# Patient Record
Sex: Male | Born: 1990 | Race: Black or African American | Hispanic: No | Marital: Single | Smoking: Current every day smoker
Health system: Southern US, Community
[De-identification: ages and names within clinical notes are randomized; demographics above are authoritative.]

---
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
## 2013-08-22 ENCOUNTER — Emergency Department: Payer: Self-pay | Admitting: Emergency Medicine

## 2016-11-10 ENCOUNTER — Emergency Department
Admission: EM | Admit: 2016-11-10 | Discharge: 2016-11-10 | Disposition: A | Discharge status: Home / Self Care | Payer: Managed Care, Other (non HMO) | Attending: Emergency Medicine | Admitting: Emergency Medicine

## 2016-11-10 ENCOUNTER — Encounter: Payer: Self-pay | Admitting: Emergency Medicine

## 2016-11-10 DIAGNOSIS — Y990 Civilian activity done for income or pay: Secondary | ICD-10-CM | POA: Diagnosis not present

## 2016-11-10 DIAGNOSIS — X500XXA Overexertion from strenuous movement or load, initial encounter: Secondary | ICD-10-CM | POA: Insufficient documentation

## 2016-11-10 DIAGNOSIS — Y929 Unspecified place or not applicable: Secondary | ICD-10-CM | POA: Insufficient documentation

## 2016-11-10 DIAGNOSIS — F17200 Nicotine dependence, unspecified, uncomplicated: Secondary | ICD-10-CM | POA: Insufficient documentation

## 2016-11-10 DIAGNOSIS — S39012A Strain of muscle, fascia and tendon of lower back, initial encounter: Secondary | ICD-10-CM | POA: Diagnosis not present

## 2016-11-10 DIAGNOSIS — S3992XA Unspecified injury of lower back, initial encounter: Secondary | ICD-10-CM | POA: Diagnosis present

## 2016-11-10 DIAGNOSIS — Y93F2 Activity, caregiving, lifting: Secondary | ICD-10-CM | POA: Insufficient documentation

## 2016-11-10 MED ORDER — METHOCARBAMOL 500 MG PO TABS: 500.0000 mg | ORAL_TABLET | Freq: Four times a day (QID) | ORAL | 0 refills | Status: DC

## 2016-11-10 MED ORDER — MELOXICAM 15 MG PO TABS: 15.0000 mg | ORAL_TABLET | Freq: Every day | ORAL | 0 refills | Status: DC

## 2016-11-10 NOTE — ED Triage Notes (Signed)
Low back pain after lifting boxes 3 days ago

## 2016-11-10 NOTE — ED Provider Notes (Signed)
[facility] Emergency Department Provider Note  ____________________________________________  Time seen: Approximately 6:29 PM  I have reviewed the triage vital signs and the nursing notes.   HISTORY  Chief Complaint Back Pain    HPI Glen Allen is a 26 y.o. male who presents to emergency room complaining of lower back pain. Patient states that he is at work lifting heavy boxes when he felt a pulling sensation in his lower back. Patient states that the pain has been improving since time of injury but continues to bother him. He denies any radicular symptoms. No bowel or bladder function, saddle anesthesia, para seizures. No direct trauma to the back. The patient is not tried any medications for this complaint prior to arrival.   History reviewed. No pertinent past medical history.  There are no active problems to display for this patient.   History reviewed. No pertinent surgical history.  Prior to Admission medications   Medication Sig Start Date End Date Taking? Authorizing Provider  meloxicam (MOBIC) 15 MG tablet Take 1 tablet (15 mg total) by mouth daily. 11/10/16   Delorise RoyalsJonathan D Cuthriell, PA-C  methocarbamol (ROBAXIN) 500 MG tablet Take 1 tablet (500 mg total) by mouth 4 (four) times daily. 11/10/16   Delorise RoyalsJonathan D Cuthriell, PA-C    Allergies Patient has no known allergies.  No family history on file.  Social History Social History  Substance Use Topics  . Smoking status: Not on file  . Smokeless tobacco: Current User  . Alcohol use Not on file     Review of Systems  Constitutional: No fever/chills Eyes: No visual changes. No discharge ENT: No upper respiratory complaints. Cardiovascular: no chest pain. Respiratory: no cough. No SOB. Gastrointestinal: No abdominal pain.  No nausea, no vomiting.  No diarrhea.  No constipation. Genitourinary: Negative for dysuria. No hematuri Musculoskeletal: Positive for lower back pain Skin: Negative  for rash, abrasions, lacerations, ecchymosis. Neurological: Negative for headaches, focal weakness or numbness. 10-point ROS otherwise negative.  ____________________________________________   PHYSICAL EXAM:  VITAL SIGNS: ED Triage Vitals  Enc Vitals Group     BP 11/10/16 1808 138/81     Pulse Rate 11/10/16 1808 82     Resp 11/10/16 1808 18     Temp 11/10/16 1808 98.1 F (36.7 C)     Temp Source 11/10/16 1808 Oral     SpO2 11/10/16 1808 95 %     Weight 11/10/16 1810 235 lb (106.6 kg)     Height 11/10/16 1810 6\' 1"  (1.854 m)     Head Circumference --      Peak Flow --      Pain Score 11/10/16 1810 6     Pain Loc --      Pain Edu? --      Excl. in GC? --      Constitutional: Alert and oriented. Well appearing and in no acute distress. Eyes: Conjunctivae are normal. PERRL. EOMI. Head: Atraumatic. Neck: No stridor.    Cardiovascular: Normal rate, regular rhythm. Normal S1 and S2.  Good peripheral circulation. Respiratory: Normal respiratory effort without tachypnea or retractions. Lungs CTAB. Good air entry to the bases with no decreased or absent breath sounds. Musculoskeletal: Full range of motion to all extremities. No gross deformities appreciated. No deformities noted a spot upon inspection. Full range of motion to the lumbar spine. Patient is nontender to palpation over the osseous structures of the spine. No paraspinal muscle tenderness. No tenderness to palpation over bilateral sciatic notches. Negative  straight leg raise bilaterally. Dorsalis pedis pulse intact bilateral lower extremities. Sensation intact and equal bilateral lower extremities. Neurologic:  Normal speech and language. No gross focal neurologic deficits are appreciated.  Skin:  Skin is warm, dry and intact. No rash noted. Psychiatric: Mood and affect are normal. Speech and behavior are normal. Patient exhibits appropriate insight and judgement.   ____________________________________________    LABS (all labs ordered are listed, but only abnormal results are displayed)  Labs Reviewed - No data to display ____________________________________________  EKG   ____________________________________________  RADIOLOGY   No results found.  ____________________________________________    PROCEDURES  Procedure(s) performed:    Procedures    Medications - No data to display   ____________________________________________   INITIAL IMPRESSION / ASSESSMENT AND PLAN / ED COURSE  Pertinent labs & imaging results that were available during my care of the patient were reviewed by me and considered in my medical decision making (see chart for details).  Review of the [place] CSRS was performed in accordance of the [facility] prior to dispensing any controlled drugs.     Patient's diagnosis is consistent with lumbar strain. Patient's symptoms are consistent with strain. They are improving at this time. No indication for labs or imaging.. Patient will be discharged home with prescriptions for anti-inflammatories and muscle relaxers to treat symptoms. Patient is to follow up with primary care as needed or otherwise directed. Patient is given ED precautions to return to the ED for any worsening or new symptoms.     ____________________________________________  FINAL CLINICAL IMPRESSION(S) / ED DIAGNOSES  Final diagnoses:  Strain of lumbar region, initial encounter      NEW MEDICATIONS STARTED DURING THIS VISIT:  New Prescriptions   MELOXICAM (MOBIC) 15 MG TABLET    Take 1 tablet (15 mg total) by mouth daily.   METHOCARBAMOL (ROBAXIN) 500 MG TABLET    Take 1 tablet (500 mg total) by mouth 4 (four) times daily.        This chart was dictated using voice recognition software/Dragon. Despite best efforts to proofread, errors can occur which can change the meaning. Any change was purely unintentional.    Racheal Patches, PA-C 11/10/16 Shona Simpson,  MD 11/10/16 2042

## 2016-11-24 ENCOUNTER — Emergency Department
Admission: EM | Admit: 2016-11-24 | Discharge: 2016-11-24 | Disposition: A | Discharge status: Home / Self Care | Payer: Managed Care, Other (non HMO) | Attending: Emergency Medicine | Admitting: Emergency Medicine

## 2016-11-24 DIAGNOSIS — F17210 Nicotine dependence, cigarettes, uncomplicated: Secondary | ICD-10-CM | POA: Insufficient documentation

## 2016-11-24 DIAGNOSIS — H10022 Other mucopurulent conjunctivitis, left eye: Secondary | ICD-10-CM | POA: Insufficient documentation

## 2016-11-24 DIAGNOSIS — H578 Other specified disorders of eye and adnexa: Secondary | ICD-10-CM | POA: Diagnosis present

## 2016-11-24 MED ORDER — GENTAMICIN SULFATE 0.3 % OP SOLN: 1.0000 [drp] | OPHTHALMIC | 0 refills | Status: AC

## 2016-11-24 MED ORDER — NAPHAZOLINE HCL 0.1 % OP SOLN: 1.0000 [drp] | Freq: Four times a day (QID) | OPHTHALMIC | 0 refills | Status: AC | PRN

## 2016-11-24 NOTE — ED Triage Notes (Signed)
Pt reports for the last 3 days his left eye has been red and watery - this am his eye was matted shut - denies itching or pain

## 2016-11-24 NOTE — ED Provider Notes (Signed)
[facility] Emergency Department Provider Note   ____________________________________________   First MD Initiated Contact with Patient 11/24/16 1858     (approximate)  I have reviewed the triage vital signs and the nursing notes.   HISTORY  Chief Complaint Conjunctivitis    HPI Glen Allen is a 26 y.o. male patient complain of purulent discharge from the left eye. Patient state onset 3 days ago and got progressively worse. Patient state his continue discharge and matted eyelids. Patient denies any vision disturbance. Patient believe is right has not become and affect. No palliative measures taken for this complaint. Patient denies pain with this complaint. Patient do not wear contact lenses.   History reviewed. No pertinent past medical history.  There are no active problems to display for this patient.   History reviewed. No pertinent surgical history.  Prior to Admission medications   Medication Sig Start Date End Date Taking? Authorizing Provider  gentamicin (GARAMYCIN) 0.3 % ophthalmic solution Place 1 drop into both eyes every 4 (four) hours. 11/24/16   Joni Reining, PA-C  meloxicam (MOBIC) 15 MG tablet Take 1 tablet (15 mg total) by mouth daily. 11/10/16   Delorise Royals Cuthriell, PA-C  methocarbamol (ROBAXIN) 500 MG tablet Take 1 tablet (500 mg total) by mouth 4 (four) times daily. 11/10/16   Delorise Royals Cuthriell, PA-C  naphazoline (NAPHCON) 0.1 % ophthalmic solution Place 1 drop into both eyes 4 (four) times daily as needed for irritation. 11/24/16   Joni Reining, PA-C    Allergies Patient has no known allergies.  No family history on file.  Social History Social History  Substance Use Topics  . Smoking status: Current Every Day Smoker    Packs/day: 0.50    Types: Cigarettes  . Smokeless tobacco: Current User  . Alcohol use Yes     Comment: occ    Review of Systems Constitutional: No fever/chills Eyes: No visual  changes. ENT: No sore throat. Drainage and matted eyelids. Cardiovascular: Denies chest pain. Respiratory: Denies shortness of breath. Gastrointestinal: No abdominal pain.  No nausea, no vomiting.  No diarrhea.  No constipation. Genitourinary: Negative for dysuria. Musculoskeletal: Negative for back pain. Skin: Negative for rash. Neurological: Negative for headaches, focal weakness or numbness.    ____________________________________________   PHYSICAL EXAM:  VITAL SIGNS: ED Triage Vitals [11/24/16 1844]  Enc Vitals Group     BP 121/72     Pulse Rate 79     Resp 16     Temp 97.2 F (36.2 C)     Temp Source Oral     SpO2 99 %     Weight 225 lb (102.1 kg)     Height 6\' 1"  (1.854 m)     Head Circumference      Peak Flow      Pain Score 0     Pain Loc      Pain Edu?      Excl. in GC?     Constitutional: Alert and oriented. Well appearing and in no acute distress. Eyes: Conjunctivae are Erythematous. PERRL. EOMI. dry and greenish yellowish secretions left lateral eye. Head: Atraumatic. Nose: No congestion/rhinnorhea. Mouth/Throat: Mucous membranes are moist.  Oropharynx non-erythematous. Neck: No stridor.  No cervical spine tenderness to palpation. Hematological/Lymphatic/Immunilogical: No cervical lymphadenopathy. Cardiovascular: Normal rate, regular rhythm. Grossly normal heart sounds.  Good peripheral circulation. Respiratory: Normal respiratory effort.  No retractions. Lungs CTAB. Gastrointestinal: Soft and nontender. No distention. No abdominal bruits. No CVA tenderness. Musculoskeletal:  No lower extremity tenderness nor edema.  No joint effusions. Neurologic:  Normal speech and language. No gross focal neurologic deficits are appreciated. No gait instability. Skin:  Skin is warm, dry and intact. No rash noted. Psychiatric: Mood and affect are normal. Speech and behavior are normal.  ____________________________________________   LABS (all labs ordered are  listed, but only abnormal results are displayed)  Labs Reviewed - No data to display ____________________________________________  EKG   ____________________________________________  RADIOLOGY   ____________________________________________   PROCEDURES  Procedure(s) performed: None  Procedures  Critical Care performed: No  ____________________________________________   INITIAL IMPRESSION / ASSESSMENT AND PLAN / ED COURSE  Pertinent labs & imaging results that were available during my care of the patient were reviewed by me and considered in my medical decision making (see chart for details).  Conjunctivitis. Patient given discharge care. Patient given a prescription for gentamicin and Naphcon eyedrops. Patient given a work note. Patient advised follow-up with family clinic if no improvement 3-5 days.      ____________________________________________   FINAL CLINICAL IMPRESSION(S) / ED DIAGNOSES  Final diagnoses:  Other mucopurulent conjunctivitis of left eye      NEW MEDICATIONS STARTED DURING THIS VISIT:  New Prescriptions   GENTAMICIN (GARAMYCIN) 0.3 % OPHTHALMIC SOLUTION    Place 1 drop into both eyes every 4 (four) hours.   NAPHAZOLINE (NAPHCON) 0.1 % OPHTHALMIC SOLUTION    Place 1 drop into both eyes 4 (four) times daily as needed for irritation.     Note:  This document was prepared using Dragon voice recognition software and may include unintentional dictation errors.    Joni Reiningonald K Raquel Sayres, PA-C 11/24/16 1907    Loleta Roseory Forbach, MD 11/24/16 2021

## 2017-01-07 ENCOUNTER — Encounter: Payer: Self-pay | Admitting: Emergency Medicine

## 2017-01-07 ENCOUNTER — Emergency Department
Admission: EM | Admit: 2017-01-07 | Discharge: 2017-01-07 | Disposition: A | Discharge status: Home / Self Care | Payer: Managed Care, Other (non HMO) | Attending: Emergency Medicine | Admitting: Emergency Medicine

## 2017-01-07 DIAGNOSIS — F17210 Nicotine dependence, cigarettes, uncomplicated: Secondary | ICD-10-CM | POA: Diagnosis not present

## 2017-01-07 DIAGNOSIS — Z79899 Other long term (current) drug therapy: Secondary | ICD-10-CM | POA: Diagnosis not present

## 2017-01-07 DIAGNOSIS — R202 Paresthesia of skin: Secondary | ICD-10-CM

## 2017-01-07 DIAGNOSIS — R200 Anesthesia of skin: Secondary | ICD-10-CM | POA: Diagnosis present

## 2017-01-07 MED ORDER — METHYLPREDNISOLONE 4 MG PO TBPK: ORAL_TABLET | ORAL | 0 refills | Status: DC

## 2017-01-07 NOTE — ED Triage Notes (Signed)
Right thumb and index finger with numbness for about a week.

## 2017-01-07 NOTE — ED Provider Notes (Signed)
[facility] Emergency Department Provider Note   ____________________________________________   First MD Initiated Contact with Patient 01/07/17 1035     (approximate)  I have reviewed the triage vital signs and the nursing notes.   HISTORY  Chief Complaint Numbness    HPI Glen Allen is a 26 y.o. male patient complain of numbness involving the right thumb and right index finger for about one week. 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 Patient is left-hand dominant. Except for smoking patient's past medical history is not conducted to complaint.  History reviewed. No pertinent past medical history.  There are no active problems to display for this patient.   History reviewed. No pertinent surgical history.  Prior to Admission medications   Medication Sig Start Date End Date Taking? Authorizing Provider  gentamicin (GARAMYCIN) 0.3 % ophthalmic solution Place 1 drop into both eyes every 4 (four) hours. 11/24/16   Joni Reiningonald K Smith, PA-C  meloxicam (MOBIC) 15 MG tablet Take 1 tablet (15 mg total) by mouth daily. 11/10/16   Delorise RoyalsJonathan D Cuthriell, PA-C  methocarbamol (ROBAXIN) 500 MG tablet Take 1 tablet (500 mg total) by mouth 4 (four) times daily. 11/10/16   Delorise RoyalsJonathan D Cuthriell, PA-C  methylPREDNISolone (MEDROL DOSEPAK) 4 MG TBPK tablet Take Tapered dose as directed 01/07/17   Joni Reiningonald K Smith, PA-C  naphazoline [facility](NAPHCON) 0.1 % ophthalmic solution Place 1 drop into both eyes 4 (four) times daily as needed for irritation. 11/24/16   Joni Reiningonald K Smith, PA-C    Allergies Patient has no known allergies.  No family history on file.  Social History Social History  Substance Use Topics  . Smoking status: Current Every Day Smoker    Packs/day: 0.50    Types: Cigarettes  . Smokeless tobacco: Current User  . Alcohol use Yes     Comment: occ    Review of Systems Constitutional: No fever/chills Eyes: No visual  changes. ENT: No sore throat. Cardiovascular: Denies chest pain. Respiratory: Denies shortness of breath. Gastrointestinal: No abdominal pain.  No nausea, no vomiting.  No diarrhea.  No constipation. Genitourinary: Negative for dysuria. Musculoskeletal: Negative for back pain. Skin: Negative for rash. Neurological: Transient numbness to the first second digit right hand.    ____________________________________________   PHYSICAL EXAM:  VITAL SIGNS: ED Triage Vitals  Enc Vitals Group     BP 01/07/17 1013 102/72     Pulse Rate 01/07/17 1013 (!) 54     Resp 01/07/17 1013 15     Temp 01/07/17 1013 98.2 F (36.8 C)     Temp Source 01/07/17 1013 Oral     SpO2 01/07/17 1013 100 %     Weight 01/07/17 1014 215 lb (97.5 kg)     Height 01/07/17 1014 6\' 1"  (1.854 m)     Head Circumference --      Peak Flow --      Pain Score --      Pain Loc --      Pain Edu? --      Excl. in GC? --     Constitutional: Alert and oriented. Well appearing and in no acute distress. Eyes: Conjunctivae are normal. PERRL. EOMI. Head: Atraumatic. Nose: No congestion/rhinnorhea. Mouth/Throat: Mucous membranes are moist.  Oropharynx non-erythematous. Neck: No stridor.  No cervical spine tenderness to palpation. Hematological/Lymphatic/Immunilogical: No cervical lymphadenopathy. Cardiovascular: Asymptomatic bradycardia.. Grossly normal heart sounds.  Good peripheral circulation. Respiratory: Normal respiratory effort.  No retractions. Lungs CTAB. Gastrointestinal:  Soft and nontender. No distention. No abdominal bruits. No CVA tenderness. Musculoskeletal: No lower extremity tenderness nor edema.  No joint effusions. Neurologic:  Normal speech and language. No gross focal neurologic deficits are appreciated. No gait instability. Skin:  Skin is warm, dry and intact. No rash noted. Psychiatric: Mood and affect are normal. Speech and behavior are normal.  ____________________________________________    LABS (all labs ordered are listed, but only abnormal results are displayed)  Labs Reviewed - No data to display ____________________________________________  EKG   ____________________________________________  RADIOLOGY   ____________________________________________   PROCEDURES  Procedure(s) performed: None  Procedures  Critical Care performed: No  ____________________________________________   INITIAL IMPRESSION / ASSESSMENT AND PLAN / ED COURSE  Pertinent labs & imaging results that were available during my care of the patient were reviewed by me and considered in my medical decision making (see chart for details).  Paresthesia first and second digit right hand. Patient given discharge care instructions. Patient given a prescription for Medrol Dosepak. Patient advised follow-up with neurology if no improvement in one week.      ____________________________________________   FINAL CLINICAL IMPRESSION(S) / ED DIAGNOSES  Final diagnoses:  Paresthesia      NEW MEDICATIONS STARTED DURING THIS VISIT:  New Prescriptions   METHYLPREDNISOLONE (MEDROL DOSEPAK) 4 MG TBPK TABLET    Take Tapered dose as directed     Note:  This document was prepared using Dragon voice recognition software and may include unintentional dictation errors.    Joni Reining, PA-C 01/07/17 1044    Joni Reining, PA-C 01/07/17 1045    Minna Antis, MD 01/07/17 1501

## 2017-01-07 NOTE — ED Notes (Signed)
FIRST NURSE: right hand numbness 1st and 2nd digit x1 week.

## 2017-05-14 ENCOUNTER — Encounter: Payer: Self-pay | Admitting: *Deleted

## 2017-05-14 ENCOUNTER — Emergency Department
Admission: EM | Admit: 2017-05-14 | Discharge: 2017-05-14 | Disposition: A | Discharge status: Home / Self Care | Payer: Managed Care, Other (non HMO) | Attending: Emergency Medicine | Admitting: Emergency Medicine

## 2017-05-14 DIAGNOSIS — Y929 Unspecified place or not applicable: Secondary | ICD-10-CM | POA: Diagnosis not present

## 2017-05-14 DIAGNOSIS — Y999 Unspecified external cause status: Secondary | ICD-10-CM | POA: Insufficient documentation

## 2017-05-14 DIAGNOSIS — Z79899 Other long term (current) drug therapy: Secondary | ICD-10-CM | POA: Insufficient documentation

## 2017-05-14 DIAGNOSIS — S39012A Strain of muscle, fascia and tendon of lower back, initial encounter: Secondary | ICD-10-CM | POA: Diagnosis not present

## 2017-05-14 DIAGNOSIS — F17210 Nicotine dependence, cigarettes, uncomplicated: Secondary | ICD-10-CM | POA: Insufficient documentation

## 2017-05-14 DIAGNOSIS — S3992XA Unspecified injury of lower back, initial encounter: Secondary | ICD-10-CM | POA: Diagnosis present

## 2017-05-14 DIAGNOSIS — Y939 Activity, unspecified: Secondary | ICD-10-CM | POA: Diagnosis not present

## 2017-05-14 DIAGNOSIS — X500XXA Overexertion from strenuous movement or load, initial encounter: Secondary | ICD-10-CM | POA: Insufficient documentation

## 2017-05-14 NOTE — ED Provider Notes (Signed)
[facility] Emergency Department Provider Note   ____________________________________________   I have reviewed the triage vital signs and the nursing notes.   HISTORY  Chief Complaint Back Pain    HPI Glen Allen is a 26 y.o. male presents to the emergency department with mild back pain that developed after lifting an item to approximately mid chest tight several days ago. Patient reports during the left increased muscle tightness and lumbar back pain. Patient reports resting and taking ibuprofen with improvement of symptoms. Patient was unable to go to work due to back pain and needs a work note to return to work. Patient denies any bowel or bladder dysfunction, saddle anesthesia or radiculopathy. Patient denies fever, chills, headache, vision changes, chest pain, chest tightness, shortness of breath, abdominal pain, nausea and vomiting.  No past medical history on file.  There are no active problems to display for this patient.   No past surgical history on file.  Prior to Admission medications   Medication Sig Start Date End Date Taking? Authorizing Provider  gentamicin (GARAMYCIN) 0.3 % ophthalmic solution Place 1 drop into both eyes every 4 (four) hours. 11/24/16   Joni ReiningSmith, Ronald K, PA-C  meloxicam (MOBIC) 15 MG tablet Take 1 tablet (15 mg total) by mouth daily. 11/10/16   Cuthriell, Delorise RoyalsJonathan D, PA-C  methocarbamol (ROBAXIN) 500 MG tablet Take 1 tablet (500 mg total) by mouth 4 (four) times daily. 11/10/16   Cuthriell, Delorise RoyalsJonathan D, PA-C  methylPREDNISolone (MEDROL DOSEPAK) 4 MG TBPK tablet Take Tapered dose as directed 01/07/17   Joni ReiningSmith, Ronald K, PA-C  naphazoline [facility](NAPHCON) 0.1 % ophthalmic solution Place 1 drop into both eyes 4 (four) times daily as needed for irritation. 11/24/16   Joni ReiningSmith, Ronald K, PA-C    Allergies Patient has no known allergies.  No family history on file.  Social History Social History  Substance Use Topics  . Smoking  status: Current Every Day Smoker    Packs/day: 0.50    Types: Cigarettes  . Smokeless tobacco: Current User  . Alcohol use Yes     Comment: occ    Review of Systems Constitutional: Negative for fever/chills Eyes: No visual changes. ENT:  Negative for sore throat and for difficulty swallowing Cardiovascular: Denies chest pain. Respiratory: Denies cough. Denies shortness of breath. Gastrointestinal: No abdominal pain.  No nausea, vomiting, diarrhea. Genitourinary: Negative for dysuria. Musculoskeletal: Positive for mild mid to lumbar back pain. Skin: Negative for rash. Neurological: Negative for headaches.  Negative focal weakness or numbness. Negative for loss of consciousness. Able to ambulate. ____________________________________________   PHYSICAL EXAM:  VITAL SIGNS: ED Triage Vitals  Enc Vitals Group     BP 05/14/17 2147 100/76     Pulse Rate 05/14/17 2147 64     Resp 05/14/17 2147 20     Temp 05/14/17 2147 98.7 F (37.1 C)     Temp Source 05/14/17 2147 Oral     SpO2 05/14/17 2147 98 %     Weight 05/14/17 2145 200 lb (90.7 kg)     Height 05/14/17 2145 6\' 1"  (1.854 m)     Head Circumference --      Peak Flow --      Pain Score 05/14/17 2144 5     Pain Loc --      Pain Edu? --      Excl. in GC? --     Constitutional: Alert and oriented. Well appearing and in no acute distress.  Eyes: Conjunctivae are normal.  PERRL. EOMI  Head: Normocephalic and atraumatic. ENT:      Ears: Canals clear. TMs intact bilaterally.      Nose: No congestion/rhinnorhea.      Mouth/Throat: Mucous membranes are moist. Neck:Supple. No thyromegaly. No stridor.  Cardiovascular: Normal rate, regular rhythm. Normal S1 and S2.  Good peripheral circulation. Respiratory: Normal respiratory effort without tachypnea or retractions. Lungs CTAB. Good air entry to the bases with no decreased or absent breath sounds. Hematological/Lymphatic/Immunological: No cervical lymphadenopathy. Cardiovascular:  Normal rate, regular rhythm. Normal distal pulses. Respiratory: Normal respiratory effort. No wheezes/rales/rhonchi. Lungs CTAB with no W/R/R. Gastrointestinal: Bowel sounds 4 quadrants. Soft and nontender to palpation. No guarding or rigidity. No palpable masses. No distention. No CVA tenderness. Musculoskeletal: Mid to lumbar back pain without radiculopathy. Mild palpable tenderness along lumbar paraspinals. Normal spine range of motion without pain. Negative radiculopathy, or bladder dysfunction or saddle anesthesia. Nontender with normal range of motion in all extremities. Neurologic: Normal speech and language. No gross focal neurologic deficits are appreciated. No gait instability. Cranial nerves: II-X intact. No sensory loss or abnormal reflexes.  Skin:  Skin is warm, dry and intact. No rash noted. Psychiatric: Mood and affect are normal. Speech and behavior are normal. Patient exhibits appropriate insight and judgement.  ____________________________________________   LABS (all labs ordered are listed, but only abnormal results are displayed)  Labs Reviewed - No data to display ____________________________________________  EKG None ____________________________________________  RADIOLOGY None ____________________________________________   PROCEDURES  Procedure(s) performed: No    Critical Care performed: no ____________________________________________   INITIAL IMPRESSION / ASSESSMENT AND PLAN / ED COURSE  Pertinent labs & imaging results that were available during my care of the patient were reviewed by me and considered in my medical decision making (see chart for details).  Patient presents to emergency department with lumbar back pain secondary to lifting injury several days ago. History and physical exam findings are reassuring symptoms are consistent with mild lumbar strain. Patient has been managing symptoms with OTC ibuprofen and rest. Recommended he continue  current regimen along with incorporating improved lifting mechanics. Patient needed a return to work note, that was provided during this visit. Patient advised to follow up with PCP as needed or return to the emergency department if symptoms return or worsen. Patient informed of clinical course, understand medical decision-making process, and agree with plan. ____________________________________________   FINAL CLINICAL IMPRESSION(S) / ED DIAGNOSES  Final diagnoses:  Strain of lumbar region, initial encounter       NEW MEDICATIONS STARTED DURING THIS VISIT:  New Prescriptions   No medications on file     Note:  This document was prepared using Dragon voice recognition software and may include unintentional dictation errors.    Clois ComberLittle, Elijiah Mickley M, PA-C 05/14/17 2317    Jeanmarie PlantMcShane, James A, MD 05/17/17 [phone]

## 2017-05-14 NOTE — ED Notes (Signed)

## 2017-05-14 NOTE — ED Triage Notes (Signed)
Pt has mid back pain.  Pt popped back and picked up heavy items at work 2 days ago.  Pt continues to have back pain.  No urinary sx.  Pt alert.  States no WC.

## 2017-05-14 NOTE — Discharge Instructions (Signed)
If he noted symptoms worsening generalized tenderness to return to the emergency department. He may take over-the-counter ibuprofen instructed on the medication bottle for pain and inflammation.

## 2019-03-24 ENCOUNTER — Other Ambulatory Visit: Payer: Self-pay

## 2019-03-24 ENCOUNTER — Emergency Department
Admission: EM | Admit: 2019-03-24 | Discharge: 2019-03-24 | Disposition: A | Discharge status: Home / Self Care | Payer: Self-pay | Attending: Emergency Medicine | Admitting: Emergency Medicine

## 2019-03-24 ENCOUNTER — Encounter: Payer: Self-pay | Admitting: Emergency Medicine

## 2019-03-24 ENCOUNTER — Emergency Department: Payer: Self-pay

## 2019-03-24 DIAGNOSIS — R05 Cough: Secondary | ICD-10-CM | POA: Insufficient documentation

## 2019-03-24 DIAGNOSIS — R0789 Other chest pain: Secondary | ICD-10-CM | POA: Insufficient documentation

## 2019-03-24 DIAGNOSIS — F17210 Nicotine dependence, cigarettes, uncomplicated: Secondary | ICD-10-CM | POA: Insufficient documentation

## 2019-03-24 DIAGNOSIS — F17228 Nicotine dependence, chewing tobacco, with other nicotine-induced disorders: Secondary | ICD-10-CM | POA: Insufficient documentation

## 2019-03-24 DIAGNOSIS — R0602 Shortness of breath: Secondary | ICD-10-CM | POA: Insufficient documentation

## 2019-03-24 MED ORDER — ALBUTEROL SULFATE HFA 108 (90 BASE) MCG/ACT IN AERS: 2.0000 | INHALATION_SPRAY | Freq: Four times a day (QID) | RESPIRATORY_TRACT | 0 refills | Status: AC | PRN

## 2019-03-24 NOTE — ED Triage Notes (Signed)
Pt denies a cough, dizziness or any other sx. States that it feels like he can't take a deep breath but may be anxiety.
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
## 2019-03-24 NOTE — Discharge Instructions (Addendum)
Please seek medical attention for any high fevers, chest pain, shortness of breath, change in behavior, persistent vomiting, bloody stool or any other new or concerning symptoms.  

## 2019-03-24 NOTE — ED Notes (Signed)
Pt given remote to tv and blanket. Pt denies any needs.
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
## 2020-04-01 ENCOUNTER — Other Ambulatory Visit: Payer: Self-pay

## 2020-04-01 ENCOUNTER — Emergency Department
Admission: EM | Admit: 2020-04-01 | Discharge: 2020-04-01 | Disposition: A | Discharge status: Home / Self Care | Payer: Self-pay | Attending: Emergency Medicine | Admitting: Emergency Medicine

## 2020-04-01 ENCOUNTER — Emergency Department: Payer: Self-pay

## 2020-04-01 DIAGNOSIS — S01112A Laceration without foreign body of left eyelid and periocular area, initial encounter: Secondary | ICD-10-CM | POA: Insufficient documentation

## 2020-04-01 DIAGNOSIS — Y999 Unspecified external cause status: Secondary | ICD-10-CM | POA: Insufficient documentation

## 2020-04-01 DIAGNOSIS — Y939 Activity, unspecified: Secondary | ICD-10-CM | POA: Insufficient documentation

## 2020-04-01 DIAGNOSIS — Y929 Unspecified place or not applicable: Secondary | ICD-10-CM | POA: Insufficient documentation

## 2020-04-01 DIAGNOSIS — Z79899 Other long term (current) drug therapy: Secondary | ICD-10-CM | POA: Insufficient documentation

## 2020-04-01 DIAGNOSIS — F17210 Nicotine dependence, cigarettes, uncomplicated: Secondary | ICD-10-CM | POA: Insufficient documentation

## 2020-04-01 DIAGNOSIS — S0990XA Unspecified injury of head, initial encounter: Secondary | ICD-10-CM | POA: Insufficient documentation

## 2020-04-01 MED ORDER — MELOXICAM 15 MG PO TABS
15.0000 mg | ORAL_TABLET | Freq: Every day | ORAL | 0 refills | Status: DC
Start: 2020-04-01 — End: 2021-01-09
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
## 2020-04-01 NOTE — Discharge Instructions (Signed)
Keep laceration clean and dry for the next 24 hours. You can take meloxicam for pain and inflammation.
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
## 2020-04-26 ENCOUNTER — Other Ambulatory Visit: Payer: Self-pay

## 2020-04-26 ENCOUNTER — Ambulatory Visit: Payer: Self-pay | Admitting: Physician Assistant

## 2020-04-26 DIAGNOSIS — Z113 Encounter for screening for infections with a predominantly sexual mode of transmission: Secondary | ICD-10-CM

## 2020-04-26 DIAGNOSIS — Z202 Contact with and (suspected) exposure to infections with a predominantly sexual mode of transmission: Secondary | ICD-10-CM

## 2020-04-26 LAB — GRAM STAIN

## 2020-04-26 MED ORDER — AZITHROMYCIN 500 MG PO TABS
1000.0000 mg | ORAL_TABLET | Freq: Once | ORAL | Status: AC
  Administered 2020-04-26: 1000 mg via ORAL

## 2020-04-26 NOTE — Progress Notes (Signed)
Gram stain reviewed, no treatment indicated. Patient treated with azithromycin per provider orders.Marland KitchenMarland KitchenBurt Knack, RN

## 2020-04-29 ENCOUNTER — Encounter: Payer: Self-pay | Admitting: Physician Assistant

## 2020-04-29 NOTE — Progress Notes (Signed)
   [facility] Department STI clinic/screening visit  Subjective:  Glen Allen is a 29 y.o. male being seen today for an STI screening visit. The patient reports they do not have symptoms.    Patient has the following medical conditions:  There are no problems to display for this patient.    Chief Complaint  Patient presents with  . SEXUALLY TRANSMITTED DISEASE    screening    HPI  Patient reports that he is not having any symptoms but is a contact to Chlamydia.  Denies chronic conditions, surgeries and regular medicines.  States last HIV testing was over 3 years ago.     See flowsheet for further details and programmatic requirements.    The following portions of the patient's history were reviewed and updated as appropriate: allergies, current medications, past medical history, past social history, past surgical history and problem list.  Objective:  There were no vitals filed for this visit.  Physical Exam Constitutional:      General: He is not in acute distress.    Appearance: Normal appearance.  HENT:     Head: Normocephalic and atraumatic.     Comments: No nits, lice, or hair loss. No cervical, supraclavicular or axillary adenopathy.    Mouth/Throat:     Mouth: Mucous membranes are moist.     Pharynx: Oropharynx is clear. No oropharyngeal exudate or posterior oropharyngeal erythema.  Eyes:     Conjunctiva/sclera: Conjunctivae normal.  Pulmonary:     Effort: Pulmonary effort is normal.  Abdominal:     Palpations: Abdomen is soft. There is no mass.     Tenderness: There is no abdominal tenderness. There is no guarding or rebound.  Genitourinary:    Penis: Normal.      Testes: Normal.     Comments: Pubic area without nits, lice, edema, erythema, lesions and inguinal adenopathy. Penis circumcised without rash, lesions and discharge at meatus. Musculoskeletal:     Cervical back: Neck supple. No tenderness.  Skin:    General: Skin is warm and  dry.     Findings: No bruising, erythema, lesion or rash.  Neurological:     Mental Status: He is alert and oriented to person, place, and time.  Psychiatric:        Mood and Affect: Mood normal.        Behavior: Behavior normal.        Thought Content: Thought content normal.        Judgment: Judgment normal.       Assessment and Plan:  Glen Allen is a 29 y.o. male presenting to the [facility] Department for STI screening  1. Screening for STD (sexually transmitted disease) Patient into clinic without symptoms. Rec condoms with all sex. Await test results.  Counseled that RN will call if needs to RTC for treatment once results are back. - Gram stain - Gonococcus culture - HIV [facility] - Syphilis Serology, [facility] - Gonococcus culture  2. Chlamydia contact Treat as a contact to Chlamydia with Azithromycin 1 g po DOT today. No sex for 7 days and until after partner completes treatment. RTC if vomits < 2 hr after taking medicine for re-treatment. - azithromycin (ZITHROMAX) tablet 1,000 mg     No follow-ups on file.  No future appointments.  Glen Holmes, PA

## 2020-04-30 LAB — GONOCOCCUS CULTURE

## 2021-01-09 ENCOUNTER — Other Ambulatory Visit: Payer: Self-pay

## 2021-01-09 ENCOUNTER — Emergency Department: Payer: No Typology Code available for payment source

## 2021-01-09 ENCOUNTER — Emergency Department
Admission: EM | Admit: 2021-01-09 | Discharge: 2021-01-09 | Disposition: A | Discharge status: Home / Self Care | Payer: No Typology Code available for payment source | Attending: Emergency Medicine | Admitting: Emergency Medicine

## 2021-01-09 DIAGNOSIS — F17290 Nicotine dependence, other tobacco product, uncomplicated: Secondary | ICD-10-CM | POA: Insufficient documentation

## 2021-01-09 DIAGNOSIS — M545 Low back pain, unspecified: Secondary | ICD-10-CM | POA: Diagnosis not present

## 2021-01-09 DIAGNOSIS — Y92410 Unspecified street and highway as the place of occurrence of the external cause: Secondary | ICD-10-CM | POA: Insufficient documentation

## 2021-01-09 DIAGNOSIS — M549 Dorsalgia, unspecified: Secondary | ICD-10-CM | POA: Diagnosis present

## 2021-01-09 MED ORDER — METHOCARBAMOL 500 MG PO TABS: 500.0000 mg | ORAL_TABLET | Freq: Three times a day (TID) | ORAL | 0 refills | Status: AC | PRN

## 2021-01-09 MED ORDER — MELOXICAM 15 MG PO TABS: 15.0000 mg | ORAL_TABLET | Freq: Every day | ORAL | 2 refills | Status: AC
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
## 2022-05-13 ENCOUNTER — Ambulatory Visit: Payer: BC Managed Care – PPO | Admitting: Orthopaedic Surgery

## 2022-05-13 ENCOUNTER — Encounter: Payer: Self-pay | Admitting: Orthopaedic Surgery

## 2022-05-13 ENCOUNTER — Ambulatory Visit: Payer: Self-pay

## 2022-05-13 ENCOUNTER — Ambulatory Visit (INDEPENDENT_AMBULATORY_CARE_PROVIDER_SITE_OTHER): Payer: BC Managed Care – PPO

## 2022-05-13 VITALS — BP 129/82

## 2022-05-13 DIAGNOSIS — M25562 Pain in left knee: Secondary | ICD-10-CM

## 2022-05-13 DIAGNOSIS — M25561 Pain in right knee: Secondary | ICD-10-CM

## 2022-05-13 DIAGNOSIS — G8929 Other chronic pain: Secondary | ICD-10-CM
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
## 2023-10-26 ENCOUNTER — Ambulatory Visit: Payer: Self-pay

## 2023-10-27 ENCOUNTER — Ambulatory Visit: Payer: Self-pay | Admitting: Family Medicine

## 2023-10-27 ENCOUNTER — Encounter: Payer: Self-pay | Admitting: Family Medicine

## 2023-10-27 DIAGNOSIS — Z113 Encounter for screening for infections with a predominantly sexual mode of transmission: Secondary | ICD-10-CM

## 2023-10-27 LAB — HM HIV SCREENING LAB: HM HIV Screening: NEGATIVE

## 2023-10-27 NOTE — Progress Notes (Signed)
 [facility] Department STI clinic [address] [place] [address] Main phone: [phone]  STI screening visit  Subjective:  Glen Allen is a 34 y.o. male being seen today for an STI screening visit. The patient reports they do not have symptoms.    Patient has the following medical conditions:  There are no active problems to display for this patient.   Chief Complaint  Patient presents with   SEXUALLY TRANSMITTED DISEASE    STI screening-no symptoms    HPI HPI Patient reports to clinic for routine screening  Last HIV test per patient/review of record was No results found for: HMHIVSCREEN No results found for: HIV  Last HEPC test per patient/review of record was No results found for: HMHEPCSCREEN No components found for: HEPC   Last HEPB test per patient/review of record was No components found for: HMHEPBSCREEN No components found for: HEPC   Does the patient or their partner desires a pregnancy in the next year? No  Screening for MPX risk: Does the patient have an unexplained rash? No Is the patient MSM? No Does the patient endorse multiple sex partners or anonymous sex partners? No Did the patient have close or sexual contact with a person diagnosed with MPX? No Has the patient traveled outside the [place]  where MPX is endemic? No Is there a high clinical suspicion for MPX-- evidenced by one of the following No  -Unlikely to be chickenpox  -Lymphadenopathy  -Rash that present in same phase of evolution on any given body part   See flowsheet for further details and programmatic requirements.    There is no immunization history on file for this patient.   The following portions of the patient's history were reviewed and updated as appropriate: allergies, current medications, past medical history, past social history, past surgical history and problem list.  Objective:  There were no vitals filed for this  visit.  Physical Exam Vitals and nursing note reviewed.  Constitutional:      Appearance: Normal appearance.  HENT:     Head: Normocephalic and atraumatic.     Mouth/Throat:     Mouth: Mucous membranes are moist.     Pharynx: No oropharyngeal exudate or posterior oropharyngeal erythema.  Eyes:     General:        Right eye: No discharge.        Left eye: No discharge.     Conjunctiva/sclera:     Right eye: Right conjunctiva is not injected. No exudate.    Left eye: Left conjunctiva is not injected. No exudate. Pulmonary:     Effort: Pulmonary effort is normal.  Abdominal:     General: Abdomen is flat.     Palpations: Abdomen is soft. There is no hepatomegaly or mass.     Tenderness: There is no abdominal tenderness. There is no rebound.  Genitourinary:    Comments: Declined genital exam- asymptomatic Lymphadenopathy:     Cervical: No cervical adenopathy.     Upper Body:     Right upper body: No supraclavicular or axillary adenopathy.     Left upper body: No supraclavicular or axillary adenopathy.  Skin:    General: Skin is warm and dry.  Neurological:     Mental Status: He is alert and oriented to person, place, and time.     Assessment and Plan:  Glen Allen is a 33 y.o. male presenting to the [facility] Department for STI screening  1. Screening  for venereal disease (Primary)  - Chlamydia/GC NAA, Confirmation - HIV [facility] - Syphilis Serology, [facility]   Patient does not have STI symptoms Patient accepted all screenings including  urine GC/Chlamydia, and blood work for HIV/Syphilis. Patient meets criteria for HepB screening? No. Ordered? not applicable Patient meets criteria for HepC screening? No. Ordered? not applicable Recommended condom use with all sex Discussed importance of condom use for STI prevention  Treat positive test results per standing order. Discussed time line for [facility] results and that patient will be called  with positive results and encouraged patient to call if he had not heard in 2 weeks Recommended repeat testing in 3 months with positive results. Recommended returning for continued or worsening symptoms.   Return if symptoms worsen or fail to improve, for STI screening.  No future appointments.  Verneta Bers, [place]

## 2023-10-27 NOTE — Progress Notes (Signed)
 Pt here for STI screening.  Denies symptoms.  No in house labs performed.  Condoms declined.-Collins Scotland, RN

## 2023-10-29 LAB — CHLAMYDIA/GC NAA, CONFIRMATION
Chlamydia trachomatis, NAA: NEGATIVE
Neisseria gonorrhoeae, NAA: NEGATIVE

## 2023-11-10 NOTE — Addendum Note (Signed)
Addended by: Berdie Ogren on: 11/10/2023 12:02 PM   Modules accepted: Orders
